# Patient Record
Sex: Female | Born: 1950 | Race: White | Hispanic: No | Marital: Married | Smoking: Never smoker
Health system: Southern US, Community
[De-identification: ages and names within clinical notes are randomized; demographics above are authoritative.]

---

## 1998-04-29 ENCOUNTER — Ambulatory Visit (HOSPITAL_COMMUNITY): Admission: RE | Admit: 1998-04-29 | Discharge: 1998-04-29 | Payer: Self-pay | Admitting: *Deleted

## 1999-09-02 ENCOUNTER — Ambulatory Visit (HOSPITAL_COMMUNITY): Admission: RE | Admit: 1999-09-02 | Discharge: 1999-09-02 | Payer: Self-pay | Admitting: *Deleted

## 1999-09-02 ENCOUNTER — Encounter: Payer: Self-pay | Admitting: *Deleted

## 2000-10-16 ENCOUNTER — Encounter: Payer: Self-pay | Admitting: *Deleted

## 2000-10-16 ENCOUNTER — Ambulatory Visit (HOSPITAL_COMMUNITY): Admission: RE | Admit: 2000-10-16 | Discharge: 2000-10-16 | Payer: Self-pay | Admitting: *Deleted

## 2002-04-18 ENCOUNTER — Encounter: Payer: Self-pay | Admitting: *Deleted

## 2002-04-18 ENCOUNTER — Ambulatory Visit (HOSPITAL_COMMUNITY): Admission: RE | Admit: 2002-04-18 | Discharge: 2002-04-18 | Payer: Self-pay | Admitting: *Deleted

## 2003-06-29 ENCOUNTER — Other Ambulatory Visit: Admission: RE | Admit: 2003-06-29 | Discharge: 2003-06-29 | Payer: Self-pay | Admitting: Obstetrics and Gynecology

## 2004-12-05 ENCOUNTER — Other Ambulatory Visit: Admission: RE | Admit: 2004-12-05 | Discharge: 2004-12-05 | Payer: Self-pay | Admitting: Obstetrics and Gynecology

## 2004-12-22 ENCOUNTER — Encounter: Admission: RE | Admit: 2004-12-22 | Discharge: 2004-12-22 | Payer: Self-pay | Admitting: Obstetrics and Gynecology

## 2006-02-06 ENCOUNTER — Ambulatory Visit (HOSPITAL_COMMUNITY): Admission: RE | Admit: 2006-02-06 | Discharge: 2006-02-06 | Payer: Self-pay | Admitting: Obstetrics and Gynecology

## 2006-02-28 ENCOUNTER — Encounter: Admission: RE | Admit: 2006-02-28 | Discharge: 2006-02-28 | Payer: Self-pay | Admitting: Obstetrics and Gynecology

## 2007-04-16 ENCOUNTER — Ambulatory Visit (HOSPITAL_COMMUNITY): Admission: RE | Admit: 2007-04-16 | Discharge: 2007-04-16 | Payer: Self-pay | Admitting: Obstetrics and Gynecology

## 2008-08-31 ENCOUNTER — Ambulatory Visit: Payer: Self-pay | Admitting: Internal Medicine

## 2008-08-31 DIAGNOSIS — G4730 Sleep apnea, unspecified: Secondary | ICD-10-CM | POA: Insufficient documentation

## 2008-08-31 DIAGNOSIS — J309 Allergic rhinitis, unspecified: Secondary | ICD-10-CM | POA: Insufficient documentation

## 2008-10-06 ENCOUNTER — Ambulatory Visit (HOSPITAL_COMMUNITY): Admission: RE | Admit: 2008-10-06 | Discharge: 2008-10-06 | Payer: Self-pay | Admitting: Obstetrics and Gynecology

## 2008-10-11 ENCOUNTER — Encounter: Payer: Self-pay | Admitting: Internal Medicine

## 2008-10-11 ENCOUNTER — Ambulatory Visit (HOSPITAL_BASED_OUTPATIENT_CLINIC_OR_DEPARTMENT_OTHER): Admission: RE | Admit: 2008-10-11 | Discharge: 2008-10-11 | Payer: Self-pay | Admitting: Internal Medicine

## 2008-10-18 ENCOUNTER — Ambulatory Visit: Payer: Self-pay | Admitting: Internal Medicine

## 2008-10-29 ENCOUNTER — Ambulatory Visit: Payer: Self-pay | Admitting: Internal Medicine

## 2008-12-12 ENCOUNTER — Encounter: Payer: Self-pay | Admitting: Internal Medicine

## 2008-12-31 ENCOUNTER — Ambulatory Visit: Payer: Self-pay | Admitting: Internal Medicine

## 2010-12-04 ENCOUNTER — Encounter: Payer: Self-pay | Admitting: Obstetrics and Gynecology

## 2011-03-28 NOTE — Procedures (Signed)
NAME:  Desiree Hurley, Desiree Hurley                 ACCOUNT NO.:  [id number]   MEDICAL RECORD NO.:  [id number]          PATIENT TYPE:  OUT   LOCATION:  SLEEP CENTER                 FACILITY:  [facility]   PHYSICIAN:  Clinton D. Maple Hudson, MD, FCCP, FACPDATE OF BIRTH:  1952/04/29   DATE OF STUDY:  10/11/2008                            NOCTURNAL POLYSOMNOGRAM   REFERRING PHYSICIAN:   INDICATION FOR STUDY:  Hypersomnia with sleep apnea.   EPWORTH SLEEPINESS SCORE:  Epworth sleepiness score 13/24. BMI 33.9.  Weight 210 pounds. Height 66 inches. Neck 15 inches.   MEDICATIONS:  Home medications are charted and reviewed.   SLEEP ARCHITECTURE:  Total sleep time 286 minutes with sleep efficiency  68.5%.  Stage I was 4.5%. Stage II  65%.  Stage III 12.7%. REM 17.6% of  total sleep time. Sleep latency is 32 minutes. REM latency 255 minutes.  Awake after sleep onset, 100 minutes. Arousal index 18.4. No bedtime  medication was reported.   RESPIRATORY DATA:  Apnea-hypopnea index (AHI) 20.5 per hour. A total of  98 events were scored including 1 mixed apnea and 97 hypopneas. Most  events were recorded while sleeping supine. REM AHI 8.3. There were  insufficient events to permit split-study protocol on the study night.   OXYGEN DATA:  Loud snoring with oxygen desaturation to a nadir of 83%.  Mean oxygen saturation through the study was 93% on room air.   CARDIAC DATA:  Normal sinus rhythm.   MOVEMENT/PARASOMNIA:  No significant movement disturbance. Bathroom x3.   IMPRESSIONS/RECOMMENDATIONS:  1. Moderate obstructive sleep apnea/hypopnea syndrome, AHI 20.5 per      hour with events hypopneas recorded while sleeping supine.  Loud      snoring with oxygen desaturation to a nadir of 83%.  2. The patient did not meet numbers criteria to permit CPAP titration      by split-study protocol on the study night.  The technician      reported trying a CPAP mask for initial fitting but stated that the      patient was having  panic      attacks and the mask had to be removed.  She may do better with a      nasal pillow's design and with the full-face mask which was tried.      Clinton D. Maple Hudson, MD, [facility], FACP  Diplomate, Biomedical engineer of Sleep Medicine  Electronically Signed     CDY/MEDQ  D:  10/18/2008 14:38:28  T:  10/19/2008 00:06:38  Job:  [id number]

## 2013-03-24 ENCOUNTER — Other Ambulatory Visit: Payer: Self-pay | Admitting: Family Medicine

## 2013-03-24 ENCOUNTER — Other Ambulatory Visit (HOSPITAL_COMMUNITY): Payer: Self-pay | Admitting: Family Medicine

## 2013-03-24 ENCOUNTER — Other Ambulatory Visit (HOSPITAL_COMMUNITY)
Admission: RE | Admit: 2013-03-24 | Discharge: 2013-03-24 | Disposition: A | Discharge status: Home / Self Care | Payer: 59 | Source: Ambulatory Visit | Attending: Family Medicine | Admitting: Family Medicine

## 2013-03-24 DIAGNOSIS — Z1151 Encounter for screening for human papillomavirus (HPV): Secondary | ICD-10-CM | POA: Insufficient documentation

## 2013-03-24 DIAGNOSIS — Z0000 Encounter for general adult medical examination without abnormal findings: Secondary | ICD-10-CM | POA: Insufficient documentation

## 2013-03-24 DIAGNOSIS — E2839 Other primary ovarian failure: Secondary | ICD-10-CM

## 2013-04-28 ENCOUNTER — Other Ambulatory Visit (HOSPITAL_COMMUNITY): Payer: Self-pay | Admitting: Family Medicine

## 2013-04-28 DIAGNOSIS — Z1231 Encounter for screening mammogram for malignant neoplasm of breast: Secondary | ICD-10-CM

## 2013-04-29 ENCOUNTER — Ambulatory Visit (HOSPITAL_COMMUNITY)
Admission: RE | Admit: 2013-04-29 | Discharge: 2013-04-29 | Disposition: A | Discharge status: Home / Self Care | Payer: 59 | Source: Ambulatory Visit | Attending: Family Medicine | Admitting: Family Medicine

## 2013-04-29 DIAGNOSIS — E2839 Other primary ovarian failure: Secondary | ICD-10-CM

## 2013-04-29 DIAGNOSIS — Z13820 Encounter for screening for osteoporosis: Secondary | ICD-10-CM | POA: Insufficient documentation

## 2013-04-29 DIAGNOSIS — Z1231 Encounter for screening mammogram for malignant neoplasm of breast: Secondary | ICD-10-CM

## 2013-04-29 DIAGNOSIS — Z780 Asymptomatic menopausal state: Secondary | ICD-10-CM | POA: Insufficient documentation

## 2013-05-29 ENCOUNTER — Emergency Department (HOSPITAL_COMMUNITY)
Admission: EM | Admit: 2013-05-29 | Discharge: 2013-05-29 | Disposition: A | Discharge status: Home / Self Care | Payer: 59 | Attending: Emergency Medicine | Admitting: Emergency Medicine

## 2013-05-29 ENCOUNTER — Encounter (HOSPITAL_COMMUNITY): Payer: Self-pay

## 2013-05-29 DIAGNOSIS — Y9301 Activity, walking, marching and hiking: Secondary | ICD-10-CM | POA: Insufficient documentation

## 2013-05-29 DIAGNOSIS — Y9289 Other specified places as the place of occurrence of the external cause: Secondary | ICD-10-CM | POA: Insufficient documentation

## 2013-05-29 DIAGNOSIS — IMO0002 Reserved for concepts with insufficient information to code with codable children: Secondary | ICD-10-CM | POA: Insufficient documentation

## 2013-05-29 DIAGNOSIS — S76011A Strain of muscle, fascia and tendon of right hip, initial encounter: Secondary | ICD-10-CM

## 2013-05-29 DIAGNOSIS — X500XXA Overexertion from strenuous movement or load, initial encounter: Secondary | ICD-10-CM | POA: Insufficient documentation

## 2013-05-29 MED ORDER — CYCLOBENZAPRINE HCL 10 MG PO TABS: 10.0000 mg | ORAL_TABLET | Freq: Two times a day (BID) | ORAL | Status: AC | PRN

## 2013-05-29 MED ORDER — CYCLOBENZAPRINE HCL 10 MG PO TABS
10.0000 mg | ORAL_TABLET | Freq: Once | ORAL | Status: AC
Start: 2013-05-29 — End: 2013-05-29
  Administered 2013-05-29: 10 mg via ORAL
  Filled 2013-05-29: qty 1

## 2013-05-29 MED ORDER — IBUPROFEN 400 MG PO TABS
400.0000 mg | ORAL_TABLET | Freq: Once | ORAL | Status: AC
  Administered 2013-05-29: 400 mg via ORAL
  Filled 2013-05-29: qty 1

## 2013-05-29 NOTE — ED Provider Notes (Signed)
History    This chart was scribed for Wynetta Emery, non-physician practitioner working with Ashby Dawes, MD by Leone Payor, ED Scribe. This patient was seen in room TR10C/TR10C and the patient's care was started at 1725.  CSN: [id number] Arrival date & time 05/29/13  1725  None    Chief Complaint  Patient presents with  . Hip Pain    The history is provided by the patient. No language interpreter was used.    HPI Comments: Desiree Hurley is a 62 y.o. female who presents to the Emergency Department complaining of constant, unchanged R upper thigh pain starting earlier today. Pt states she was walking down steps when she had sudden onset pain. States she felt a pop to her upper right lateral thigh area after onset of pain. Pain is rated as 9-10/10 with movement and bearing weight. She has minimal pain with rest. She denies any recent injury or trauma to the area. Pt is able to ambulate.   History reviewed. No pertinent past medical history. History reviewed. No pertinent past surgical history. No family history on file. History  Substance Use Topics  . Smoking status: Never Smoker   . Smokeless tobacco: Not on file  . Alcohol Use: No   OB History   Grav Para Term Preterm Abortions TAB SAB Ect Mult Living                 Review of Systems  Constitutional: Negative for fever.  Respiratory: Negative for shortness of breath.   Cardiovascular: Negative for chest pain.  Gastrointestinal: Negative for nausea, vomiting, abdominal pain and diarrhea.  Musculoskeletal: Positive for myalgias (Right lateral thigh pain).  All other systems reviewed and are negative.    Allergies  Review of patient's allergies indicates no known allergies.  Home Medications   Current Outpatient Rx  Name  Route  Sig  Dispense  Refill  . Calcium Carb-Vit D-Soy Isoflav (CALTRATE 600 + SOY PO)   Oral   Take 1 tablet by mouth daily.         Marland Kitchen ibuprofen (ADVIL,MOTRIN) 200 MG tablet  Oral   Take 800 mg by mouth as needed for pain.         . Multiple Vitamins-Minerals (MULTIVITAMIN WITH MINERALS) tablet   Oral   Take 1 tablet by mouth daily.         . Omega-3 Fatty Acids (FISH OIL) 1000 MG CAPS   Oral   Take 1 capsule by mouth daily.          BP 141/80  Pulse 88  Temp(Src) 98.4 F (36.9 C) (Oral)  Resp 16  SpO2 96% Physical Exam  Musculoskeletal:       Legs: Point tenderness to palpation of the proximal right lateral thigh. Neurovascularly intact. Patient is able to ambulate with antalgic gait.     ED Course  Procedures (including critical care time)  DIAGNOSTIC STUDIES: Oxygen Saturation is 96% on RA, adequate by my interpretation.    COORDINATION OF CARE: 5:45 PM Discussed treatment plan with pt at bedside and pt agreed to plan.   Labs Reviewed - No data to display No results found. 1. Strain of right hip and thigh     MDM   Filed Vitals:   05/29/13 1730  BP: 141/80  Pulse: 88  Temp: 98.4 F (36.9 C)  TempSrc: Oral  Resp: 16  SpO2: 96%     Desiree Hurley is a 62 y.o. female with likely  right thigh strain, Neurovascularly intact.   Medications  cyclobenzaprine (FLEXERIL) tablet 10 mg (not administered)  ibuprofen (ADVIL,MOTRIN) tablet 400 mg (not administered)    Pt is hemodynamically stable, appropriate for, and amenable to discharge at this time. Pt verbalized understanding and agrees with care plan. Outpatient follow-up and specific return precautions discussed.    New Prescriptions   CYCLOBENZAPRINE (FLEXERIL) 10 MG TABLET    Take 1 tablet (10 mg total) by mouth 2 (two) times daily as needed for muscle spasms.     Wynetta Emery, PA-C 05/29/13 1757

## 2013-05-29 NOTE — ED Provider Notes (Signed)
Medical screening examination/treatment/procedure(s) were performed by non-physician practitioner and as supervising physician I was immediately available for consultation/collaboration.   Ashby Dawes, MD 05/29/13 [phone]

## 2013-05-29 NOTE — ED Notes (Addendum)
Rt. Hip pain began after pt. Was leaving working walking down the steps and felt something pop in her rt. Lateral hip area.  Pt. Was able to put weight on the leg. But intense pain.  Pt. Has been having intermittent pain this past week in the same hip.  Denies any  injury

## 2013-06-10 ENCOUNTER — Other Ambulatory Visit (HOSPITAL_COMMUNITY): Payer: Self-pay | Admitting: Orthopedic Surgery

## 2013-06-10 DIAGNOSIS — M545 Low back pain, unspecified: Secondary | ICD-10-CM

## 2013-06-18 ENCOUNTER — Ambulatory Visit (HOSPITAL_COMMUNITY)
Admission: RE | Admit: 2013-06-18 | Discharge: 2013-06-18 | Disposition: A | Discharge status: Home / Self Care | Payer: 59 | Source: Ambulatory Visit | Attending: Orthopedic Surgery | Admitting: Orthopedic Surgery

## 2013-06-18 DIAGNOSIS — Q762 Congenital spondylolisthesis: Secondary | ICD-10-CM | POA: Insufficient documentation

## 2013-06-18 DIAGNOSIS — M129 Arthropathy, unspecified: Secondary | ICD-10-CM | POA: Insufficient documentation

## 2013-06-18 DIAGNOSIS — M545 Low back pain, unspecified: Secondary | ICD-10-CM

## 2013-08-28 ENCOUNTER — Other Ambulatory Visit: Payer: Self-pay | Admitting: Gastroenterology

## 2016-05-26 DIAGNOSIS — L0100 Impetigo, unspecified: Secondary | ICD-10-CM | POA: Diagnosis not present

## 2016-05-26 MED FILL — CEPHALEXIN 500 MG CAPSULE: 500 | 7 days supply | Qty: 21 | Fill #0

## 2016-05-26 MED FILL — MUPIROCIN 2% OINTMENT: 2 | 10 days supply | Qty: 22 | Fill #0

## 2016-05-30 DIAGNOSIS — L0100 Impetigo, unspecified: Secondary | ICD-10-CM | POA: Diagnosis not present

## 2016-05-30 MED FILL — DOXYCYCLINE HYCLATE 100 MG: 100 | 10 days supply | Qty: 20 | Fill #0

## 2016-05-30 MED FILL — SULFAMETHOXAZOLE-TMP DS TAB: 800-160 | 10 days supply | Qty: 40 | Fill #0

## 2016-06-05 DIAGNOSIS — L309 Dermatitis, unspecified: Secondary | ICD-10-CM | POA: Diagnosis not present

## 2016-06-05 DIAGNOSIS — D239 Other benign neoplasm of skin, unspecified: Secondary | ICD-10-CM | POA: Diagnosis not present

## 2016-06-05 MED FILL — HYDROCORTISONE 2.5% OINT: 2.5 | 10 days supply | Qty: 28 | Fill #0

## 2016-06-05 MED FILL — metroNIDAZOLE 500 MG TABS: 500 | 30 days supply | Qty: 15 | Fill #0

## 2016-06-15 DIAGNOSIS — L309 Dermatitis, unspecified: Secondary | ICD-10-CM | POA: Diagnosis not present

## 2016-07-06 MED FILL — metroNIDAZOLE 0.75 % LOTN: 0.75 | 30 days supply | Qty: 59 | Fill #0

## 2017-01-29 ENCOUNTER — Encounter (HOSPITAL_COMMUNITY): Payer: Self-pay

## 2017-01-29 ENCOUNTER — Ambulatory Visit (HOSPITAL_COMMUNITY)
Admission: EM | Admit: 2017-01-29 | Discharge: 2017-01-29 | Disposition: A | Discharge status: Home / Self Care | Payer: 59 | Attending: Family Medicine | Admitting: Family Medicine

## 2017-01-29 DIAGNOSIS — H9203 Otalgia, bilateral: Secondary | ICD-10-CM

## 2017-01-29 DIAGNOSIS — H6983 Other specified disorders of Eustachian tube, bilateral: Secondary | ICD-10-CM

## 2017-01-29 MED ORDER — IPRATROPIUM BROMIDE 0.06 % NA SOLN: 2.0000 | Freq: Four times a day (QID) | NASAL | 0 refills | Status: AC

## 2017-01-29 MED ORDER — METHYLPREDNISOLONE 4 MG PO TBPK: ORAL_TABLET | ORAL | 0 refills | Status: AC

## 2017-01-29 NOTE — ED Provider Notes (Signed)
CSN: [id number]     Arrival date & time 01/29/17  1642 History   None    Chief Complaint  Patient presents with  . Otalgia   (Consider location/radiation/quality/duration/timing/severity/associated sxs/prior Treatment) Patient c/o bilateral ear discomfort and nasal drainage.   The history is provided by the patient.  Otalgia  Location:  Bilateral Behind ear:  No abnormality Quality:  Aching Severity:  Mild Onset quality:  Sudden Duration:  1 day Timing:  Constant Progression:  Waxing and waning Chronicity:  New Relieved by:  Nothing Worsened by:  Nothing Ineffective treatments:  None tried Associated symptoms: rhinorrhea     History reviewed. No pertinent past medical history. History reviewed. No pertinent surgical history. History reviewed. No pertinent family history. Social History  Substance Use Topics  . Smoking status: Never Smoker  . Smokeless tobacco: Never Used  . Alcohol use No   OB History    No data available     Review of Systems  Constitutional: Negative.   HENT: Positive for ear pain and rhinorrhea.   Eyes: Negative.   Respiratory: Negative.   Cardiovascular: Negative.   Gastrointestinal: Negative.   Endocrine: Negative.   Genitourinary: Negative.   Musculoskeletal: Negative.   Allergic/Immunologic: Negative.   Neurological: Negative.   Hematological: Negative.   Psychiatric/Behavioral: Negative.     Allergies  Patient has no known allergies.  Home Medications   Prior to Admission medications   Medication Sig Start Date End Date Taking? Authorizing Provider  Calcium Carb-Vit D-Soy Isoflav (CALTRATE 600 + SOY PO) Take 1 tablet by mouth daily.   Yes Historical Provider, MD  Multiple Vitamins-Minerals (MULTIVITAMIN WITH MINERALS) tablet Take 1 tablet by mouth daily.   Yes Historical Provider, MD  Omega-3 Fatty Acids (FISH OIL) 1000 MG CAPS Take 1 capsule by mouth daily.   Yes Historical Provider, MD  cyclobenzaprine (FLEXERIL) 10 MG  tablet Take 1 tablet (10 mg total) by mouth 2 (two) times daily as needed for muscle spasms. 05/29/13   Nicole Pisciotta, PA-C  ibuprofen (ADVIL,MOTRIN) 200 MG tablet Take 800 mg by mouth as needed for pain.    Historical Provider, MD  ipratropium (ATROVENT) 0.06 % nasal spray Place 2 sprays into both nostrils 4 (four) times daily. 01/29/17   Lysbeth Penner, FNP  methylPREDNISolone (MEDROL DOSEPAK) 4 MG TBPK tablet Take 6-5-4-3-2-1 po qd 01/29/17   Lysbeth Penner, FNP   Meds Ordered and Administered this Visit  Medications - No data to display  BP 131/62 (BP Location: Left Arm)   Pulse 86   Temp 98.4 F (36.9 C) (Oral)   Resp 17   SpO2 97%  No data found.   Physical Exam  Constitutional: She is oriented to person, place, and time. She appears well-developed and well-nourished.  HENT:  Head: Normocephalic and atraumatic.  Mouth/Throat: Oropharynx is clear and moist.  Left TM retracted Right TM bulging  Eyes: Conjunctivae and EOM are normal. Pupils are equal, round, and reactive to light.  Neck: Normal range of motion. Neck supple.  Cardiovascular: Normal rate, regular rhythm and normal heart sounds.   Pulmonary/Chest: Effort normal and breath sounds normal.  Abdominal: Soft. Bowel sounds are normal.  Neurological: She is alert and oriented to person, place, and time.  Nursing note and vitals reviewed.   Urgent Care Course     Procedures (including critical care time)  Labs Review Labs Reviewed - No data to display  Imaging Review No results found.   Visual Acuity Review  Right  Eye Distance:   Left Eye Distance:   Bilateral Distance:    Right Eye Near:   Left Eye Near:    Bilateral Near:         MDM   1. Otalgia of both ears   2. ETD (Eustachian tube dysfunction), bilateral    Medrol dose pack as directed 4mg  #21 Ipratropium Nasal Spray 0.06% 2 sprays per nostril tid  Push po fluids, rest, tylenol and motrin otc prn as directed for fever, arthralgias,  and myalgias.  Follow up prn if sx's continue or persist.    Lysbeth Penner, FNP 01/29/17 [phone]

## 2017-01-29 NOTE — ED Triage Notes (Addendum)
Patient presents to [facility] with bilateral ear pain, pt states she is also experiencing nasal drainage since last night 01/28/2017. Pt thinks possible ear infection

## 2017-01-30 MED FILL — METHYLPREDNISOLONE 4 MG TAB: 4 | 6 days supply | Qty: 21 | Fill #0

## 2017-01-30 MED FILL — IPRATROPIUM 0.06% SPRAY: 0.06 | 30 days supply | Qty: 15 | Fill #0

## 2017-04-04 DIAGNOSIS — M9903 Segmental and somatic dysfunction of lumbar region: Secondary | ICD-10-CM | POA: Diagnosis not present

## 2017-04-04 DIAGNOSIS — S335XXA Sprain of ligaments of lumbar spine, initial encounter: Secondary | ICD-10-CM | POA: Diagnosis not present

## 2017-04-04 DIAGNOSIS — M9906 Segmental and somatic dysfunction of lower extremity: Secondary | ICD-10-CM | POA: Diagnosis not present

## 2017-04-04 DIAGNOSIS — S83411A Sprain of medial collateral ligament of right knee, initial encounter: Secondary | ICD-10-CM | POA: Diagnosis not present

## 2017-04-05 DIAGNOSIS — M9906 Segmental and somatic dysfunction of lower extremity: Secondary | ICD-10-CM | POA: Diagnosis not present

## 2017-04-05 DIAGNOSIS — S83411A Sprain of medial collateral ligament of right knee, initial encounter: Secondary | ICD-10-CM | POA: Diagnosis not present

## 2017-04-05 DIAGNOSIS — M9903 Segmental and somatic dysfunction of lumbar region: Secondary | ICD-10-CM | POA: Diagnosis not present

## 2017-04-05 DIAGNOSIS — S335XXA Sprain of ligaments of lumbar spine, initial encounter: Secondary | ICD-10-CM | POA: Diagnosis not present

## 2017-04-10 DIAGNOSIS — M9903 Segmental and somatic dysfunction of lumbar region: Secondary | ICD-10-CM | POA: Diagnosis not present

## 2017-04-10 DIAGNOSIS — S83411A Sprain of medial collateral ligament of right knee, initial encounter: Secondary | ICD-10-CM | POA: Diagnosis not present

## 2017-04-10 DIAGNOSIS — M9906 Segmental and somatic dysfunction of lower extremity: Secondary | ICD-10-CM | POA: Diagnosis not present

## 2017-04-10 DIAGNOSIS — S335XXA Sprain of ligaments of lumbar spine, initial encounter: Secondary | ICD-10-CM | POA: Diagnosis not present

## 2017-04-11 DIAGNOSIS — S335XXA Sprain of ligaments of lumbar spine, initial encounter: Secondary | ICD-10-CM | POA: Diagnosis not present

## 2017-04-11 DIAGNOSIS — M9903 Segmental and somatic dysfunction of lumbar region: Secondary | ICD-10-CM | POA: Diagnosis not present

## 2017-04-11 DIAGNOSIS — M9906 Segmental and somatic dysfunction of lower extremity: Secondary | ICD-10-CM | POA: Diagnosis not present

## 2017-04-11 DIAGNOSIS — S83411A Sprain of medial collateral ligament of right knee, initial encounter: Secondary | ICD-10-CM | POA: Diagnosis not present

## 2017-04-16 DIAGNOSIS — S335XXA Sprain of ligaments of lumbar spine, initial encounter: Secondary | ICD-10-CM | POA: Diagnosis not present

## 2017-04-16 DIAGNOSIS — M9903 Segmental and somatic dysfunction of lumbar region: Secondary | ICD-10-CM | POA: Diagnosis not present

## 2017-04-16 DIAGNOSIS — S83411A Sprain of medial collateral ligament of right knee, initial encounter: Secondary | ICD-10-CM | POA: Diagnosis not present

## 2017-04-16 DIAGNOSIS — M9906 Segmental and somatic dysfunction of lower extremity: Secondary | ICD-10-CM | POA: Diagnosis not present

## 2017-04-18 DIAGNOSIS — M9906 Segmental and somatic dysfunction of lower extremity: Secondary | ICD-10-CM | POA: Diagnosis not present

## 2017-04-18 DIAGNOSIS — S335XXA Sprain of ligaments of lumbar spine, initial encounter: Secondary | ICD-10-CM | POA: Diagnosis not present

## 2017-04-18 DIAGNOSIS — M9903 Segmental and somatic dysfunction of lumbar region: Secondary | ICD-10-CM | POA: Diagnosis not present

## 2017-04-18 DIAGNOSIS — S83411A Sprain of medial collateral ligament of right knee, initial encounter: Secondary | ICD-10-CM | POA: Diagnosis not present

## 2017-04-23 DIAGNOSIS — S83411A Sprain of medial collateral ligament of right knee, initial encounter: Secondary | ICD-10-CM | POA: Diagnosis not present

## 2017-04-23 DIAGNOSIS — S335XXA Sprain of ligaments of lumbar spine, initial encounter: Secondary | ICD-10-CM | POA: Diagnosis not present

## 2017-04-23 DIAGNOSIS — M9906 Segmental and somatic dysfunction of lower extremity: Secondary | ICD-10-CM | POA: Diagnosis not present

## 2017-04-23 DIAGNOSIS — M9903 Segmental and somatic dysfunction of lumbar region: Secondary | ICD-10-CM | POA: Diagnosis not present

## 2017-04-25 DIAGNOSIS — S335XXA Sprain of ligaments of lumbar spine, initial encounter: Secondary | ICD-10-CM | POA: Diagnosis not present

## 2017-04-25 DIAGNOSIS — M9906 Segmental and somatic dysfunction of lower extremity: Secondary | ICD-10-CM | POA: Diagnosis not present

## 2017-04-25 DIAGNOSIS — S83411A Sprain of medial collateral ligament of right knee, initial encounter: Secondary | ICD-10-CM | POA: Diagnosis not present

## 2017-04-25 DIAGNOSIS — M9903 Segmental and somatic dysfunction of lumbar region: Secondary | ICD-10-CM | POA: Diagnosis not present

## 2017-04-30 DIAGNOSIS — M9903 Segmental and somatic dysfunction of lumbar region: Secondary | ICD-10-CM | POA: Diagnosis not present

## 2017-04-30 DIAGNOSIS — S335XXA Sprain of ligaments of lumbar spine, initial encounter: Secondary | ICD-10-CM | POA: Diagnosis not present

## 2017-04-30 DIAGNOSIS — M9906 Segmental and somatic dysfunction of lower extremity: Secondary | ICD-10-CM | POA: Diagnosis not present

## 2017-04-30 DIAGNOSIS — S83411A Sprain of medial collateral ligament of right knee, initial encounter: Secondary | ICD-10-CM | POA: Diagnosis not present

## 2017-05-02 DIAGNOSIS — M9906 Segmental and somatic dysfunction of lower extremity: Secondary | ICD-10-CM | POA: Diagnosis not present

## 2017-05-02 DIAGNOSIS — M9903 Segmental and somatic dysfunction of lumbar region: Secondary | ICD-10-CM | POA: Diagnosis not present

## 2017-05-02 DIAGNOSIS — S83411A Sprain of medial collateral ligament of right knee, initial encounter: Secondary | ICD-10-CM | POA: Diagnosis not present

## 2017-05-02 DIAGNOSIS — S335XXA Sprain of ligaments of lumbar spine, initial encounter: Secondary | ICD-10-CM | POA: Diagnosis not present

## 2017-05-08 DIAGNOSIS — M9903 Segmental and somatic dysfunction of lumbar region: Secondary | ICD-10-CM | POA: Diagnosis not present

## 2017-05-08 DIAGNOSIS — S335XXA Sprain of ligaments of lumbar spine, initial encounter: Secondary | ICD-10-CM | POA: Diagnosis not present

## 2017-05-08 DIAGNOSIS — M9906 Segmental and somatic dysfunction of lower extremity: Secondary | ICD-10-CM | POA: Diagnosis not present

## 2017-05-08 DIAGNOSIS — S83411A Sprain of medial collateral ligament of right knee, initial encounter: Secondary | ICD-10-CM | POA: Diagnosis not present

## 2017-05-28 DIAGNOSIS — M9903 Segmental and somatic dysfunction of lumbar region: Secondary | ICD-10-CM | POA: Diagnosis not present

## 2017-05-28 DIAGNOSIS — S83411A Sprain of medial collateral ligament of right knee, initial encounter: Secondary | ICD-10-CM | POA: Diagnosis not present

## 2017-05-28 DIAGNOSIS — M9906 Segmental and somatic dysfunction of lower extremity: Secondary | ICD-10-CM | POA: Diagnosis not present

## 2017-05-28 DIAGNOSIS — S335XXA Sprain of ligaments of lumbar spine, initial encounter: Secondary | ICD-10-CM | POA: Diagnosis not present

## 2017-05-30 DIAGNOSIS — M9906 Segmental and somatic dysfunction of lower extremity: Secondary | ICD-10-CM | POA: Diagnosis not present

## 2017-05-30 DIAGNOSIS — S335XXA Sprain of ligaments of lumbar spine, initial encounter: Secondary | ICD-10-CM | POA: Diagnosis not present

## 2017-05-30 DIAGNOSIS — M9903 Segmental and somatic dysfunction of lumbar region: Secondary | ICD-10-CM | POA: Diagnosis not present

## 2017-05-30 DIAGNOSIS — S83411A Sprain of medial collateral ligament of right knee, initial encounter: Secondary | ICD-10-CM | POA: Diagnosis not present

## 2017-12-10 DIAGNOSIS — L259 Unspecified contact dermatitis, unspecified cause: Secondary | ICD-10-CM | POA: Diagnosis not present

## 2017-12-10 DIAGNOSIS — L719 Rosacea, unspecified: Secondary | ICD-10-CM | POA: Diagnosis not present

## 2017-12-14 DIAGNOSIS — J069 Acute upper respiratory infection, unspecified: Secondary | ICD-10-CM | POA: Diagnosis not present

## 2017-12-14 DIAGNOSIS — R509 Fever, unspecified: Secondary | ICD-10-CM | POA: Diagnosis not present

## 2017-12-14 MED FILL — BENZONATATE 100 MG CAP: 100 | 6 days supply | Qty: 40 | Fill #0

## 2017-12-14 MED FILL — AZITHROMYCIN 250 MG TAB: 250 | 5 days supply | Qty: 6 | Fill #0

## 2020-08-13 ENCOUNTER — Other Ambulatory Visit: Payer: Self-pay | Admitting: Family Medicine

## 2020-08-13 DIAGNOSIS — Z1231 Encounter for screening mammogram for malignant neoplasm of breast: Secondary | ICD-10-CM

## 2020-08-16 ENCOUNTER — Other Ambulatory Visit: Payer: Self-pay

## 2020-08-16 ENCOUNTER — Ambulatory Visit
Admission: RE | Admit: 2020-08-16 | Discharge: 2020-08-16 | Disposition: A | Discharge status: Home / Self Care | Payer: Medicare Other | Source: Ambulatory Visit | Attending: Family Medicine | Admitting: Family Medicine

## 2020-08-16 DIAGNOSIS — Z1231 Encounter for screening mammogram for malignant neoplasm of breast: Secondary | ICD-10-CM

## 2021-02-17 DIAGNOSIS — Z0000 Encounter for general adult medical examination without abnormal findings: Secondary | ICD-10-CM | POA: Diagnosis not present

## 2021-02-17 DIAGNOSIS — Z1159 Encounter for screening for other viral diseases: Secondary | ICD-10-CM | POA: Diagnosis not present

## 2021-02-17 DIAGNOSIS — Z136 Encounter for screening for cardiovascular disorders: Secondary | ICD-10-CM | POA: Diagnosis not present

## 2021-02-17 DIAGNOSIS — Z23 Encounter for immunization: Secondary | ICD-10-CM | POA: Diagnosis not present

## 2021-02-23 ENCOUNTER — Other Ambulatory Visit: Payer: Self-pay | Admitting: Family Medicine

## 2021-02-23 DIAGNOSIS — M8580 Other specified disorders of bone density and structure, unspecified site: Secondary | ICD-10-CM

## 2021-03-23 DIAGNOSIS — R7309 Other abnormal glucose: Secondary | ICD-10-CM | POA: Diagnosis not present

## 2021-08-10 ENCOUNTER — Other Ambulatory Visit: Payer: Self-pay | Admitting: Family Medicine

## 2021-08-10 DIAGNOSIS — Z1231 Encounter for screening mammogram for malignant neoplasm of breast: Secondary | ICD-10-CM

## 2021-08-11 DIAGNOSIS — R7309 Other abnormal glucose: Secondary | ICD-10-CM | POA: Diagnosis not present

## 2021-11-17 ENCOUNTER — Ambulatory Visit
Admission: RE | Admit: 2021-11-17 | Discharge: 2021-11-17 | Disposition: A | Discharge status: Home / Self Care | Payer: Medicare Other | Source: Ambulatory Visit | Attending: Family Medicine | Admitting: Family Medicine

## 2021-11-17 DIAGNOSIS — M8580 Other specified disorders of bone density and structure, unspecified site: Secondary | ICD-10-CM

## 2021-11-17 DIAGNOSIS — M85852 Other specified disorders of bone density and structure, left thigh: Secondary | ICD-10-CM | POA: Diagnosis not present

## 2021-11-17 DIAGNOSIS — Z1231 Encounter for screening mammogram for malignant neoplasm of breast: Secondary | ICD-10-CM | POA: Diagnosis not present

## 2021-11-17 DIAGNOSIS — Z780 Asymptomatic menopausal state: Secondary | ICD-10-CM | POA: Diagnosis not present

## 2021-11-18 ENCOUNTER — Other Ambulatory Visit: Payer: Self-pay | Admitting: Family Medicine

## 2021-11-18 DIAGNOSIS — R928 Other abnormal and inconclusive findings on diagnostic imaging of breast: Secondary | ICD-10-CM

## 2021-12-29 ENCOUNTER — Other Ambulatory Visit: Payer: Medicare Other

## 2021-12-30 ENCOUNTER — Other Ambulatory Visit: Payer: Self-pay

## 2021-12-30 ENCOUNTER — Ambulatory Visit
Admission: RE | Admit: 2021-12-30 | Discharge: 2021-12-30 | Disposition: A | Discharge status: Home / Self Care | Payer: Medicare Other | Source: Ambulatory Visit | Attending: Family Medicine | Admitting: Family Medicine

## 2021-12-30 DIAGNOSIS — R928 Other abnormal and inconclusive findings on diagnostic imaging of breast: Secondary | ICD-10-CM

## 2021-12-30 DIAGNOSIS — R922 Inconclusive mammogram: Secondary | ICD-10-CM | POA: Diagnosis not present

## 2022-03-02 DIAGNOSIS — Z0000 Encounter for general adult medical examination without abnormal findings: Secondary | ICD-10-CM | POA: Diagnosis not present

## 2022-03-02 DIAGNOSIS — E7800 Pure hypercholesterolemia, unspecified: Secondary | ICD-10-CM | POA: Diagnosis not present

## 2022-03-02 DIAGNOSIS — E1169 Type 2 diabetes mellitus with other specified complication: Secondary | ICD-10-CM | POA: Diagnosis not present

## 2022-11-09 ENCOUNTER — Other Ambulatory Visit: Payer: Self-pay | Admitting: Family Medicine

## 2022-11-09 DIAGNOSIS — Z1231 Encounter for screening mammogram for malignant neoplasm of breast: Secondary | ICD-10-CM

## 2023-01-01 ENCOUNTER — Ambulatory Visit
Admission: RE | Admit: 2023-01-01 | Discharge: 2023-01-01 | Disposition: A | Discharge status: Home / Self Care | Payer: Medicare Other | Source: Ambulatory Visit | Attending: Family Medicine | Admitting: Family Medicine

## 2023-01-01 DIAGNOSIS — Z1231 Encounter for screening mammogram for malignant neoplasm of breast: Secondary | ICD-10-CM | POA: Diagnosis not present

## 2023-03-11 DIAGNOSIS — R051 Acute cough: Secondary | ICD-10-CM | POA: Diagnosis not present

## 2023-03-11 DIAGNOSIS — H6693 Otitis media, unspecified, bilateral: Secondary | ICD-10-CM | POA: Diagnosis not present

## 2023-03-11 DIAGNOSIS — R509 Fever, unspecified: Secondary | ICD-10-CM | POA: Diagnosis not present

## 2023-03-11 DIAGNOSIS — H1033 Unspecified acute conjunctivitis, bilateral: Secondary | ICD-10-CM | POA: Diagnosis not present

## 2023-03-11 DIAGNOSIS — H9203 Otalgia, bilateral: Secondary | ICD-10-CM | POA: Diagnosis not present

## 2023-03-30 DIAGNOSIS — H6593 Unspecified nonsuppurative otitis media, bilateral: Secondary | ICD-10-CM | POA: Diagnosis not present

## 2023-03-30 DIAGNOSIS — R070 Pain in throat: Secondary | ICD-10-CM | POA: Diagnosis not present

## 2023-03-30 DIAGNOSIS — H6993 Unspecified Eustachian tube disorder, bilateral: Secondary | ICD-10-CM | POA: Diagnosis not present

## 2023-04-03 DIAGNOSIS — G4730 Sleep apnea, unspecified: Secondary | ICD-10-CM | POA: Diagnosis not present

## 2023-04-03 DIAGNOSIS — E119 Type 2 diabetes mellitus without complications: Secondary | ICD-10-CM | POA: Diagnosis not present

## 2023-04-03 DIAGNOSIS — Z0000 Encounter for general adult medical examination without abnormal findings: Secondary | ICD-10-CM | POA: Diagnosis not present

## 2023-04-03 DIAGNOSIS — E1169 Type 2 diabetes mellitus with other specified complication: Secondary | ICD-10-CM | POA: Diagnosis not present

## 2023-04-03 DIAGNOSIS — Z1211 Encounter for screening for malignant neoplasm of colon: Secondary | ICD-10-CM | POA: Diagnosis not present

## 2023-04-03 DIAGNOSIS — E7800 Pure hypercholesterolemia, unspecified: Secondary | ICD-10-CM | POA: Diagnosis not present

## 2023-04-03 DIAGNOSIS — R030 Elevated blood-pressure reading, without diagnosis of hypertension: Secondary | ICD-10-CM | POA: Diagnosis not present

## 2023-05-16 DIAGNOSIS — E7800 Pure hypercholesterolemia, unspecified: Secondary | ICD-10-CM | POA: Diagnosis not present

## 2023-05-16 DIAGNOSIS — E1169 Type 2 diabetes mellitus with other specified complication: Secondary | ICD-10-CM | POA: Diagnosis not present

## 2023-09-06 DIAGNOSIS — H60391 Other infective otitis externa, right ear: Secondary | ICD-10-CM | POA: Diagnosis not present

## 2023-09-06 DIAGNOSIS — H9201 Otalgia, right ear: Secondary | ICD-10-CM | POA: Diagnosis not present

## 2023-12-07 ENCOUNTER — Other Ambulatory Visit: Payer: Self-pay | Admitting: Family Medicine

## 2023-12-07 DIAGNOSIS — Z1231 Encounter for screening mammogram for malignant neoplasm of breast: Secondary | ICD-10-CM

## 2024-01-03 ENCOUNTER — Ambulatory Visit: Payer: Medicare Other

## 2024-01-08 ENCOUNTER — Ambulatory Visit
Admission: RE | Admit: 2024-01-08 | Discharge: 2024-01-08 | Disposition: A | Discharge status: Home / Self Care | Payer: Medicare Other | Source: Ambulatory Visit | Attending: Family Medicine | Admitting: Family Medicine

## 2024-01-08 DIAGNOSIS — Z1231 Encounter for screening mammogram for malignant neoplasm of breast: Secondary | ICD-10-CM

## 2024-01-11 ENCOUNTER — Other Ambulatory Visit: Payer: Self-pay | Admitting: Family Medicine

## 2024-01-11 DIAGNOSIS — R928 Other abnormal and inconclusive findings on diagnostic imaging of breast: Secondary | ICD-10-CM

## 2024-01-29 IMAGING — MG MM DIGITAL DIAGNOSTIC UNILAT*L* W/ TOMO W/ CAD
4 series · 4 of 12 positions shown · non-contrast
Comparison: Previous exam(s).

CLINICAL DATA: Screening recall for a possible left breast mass.
The patient reports a history of a remote benign left breast
excision, with a faint scar in the upper breast, which was marked on
the screening study dated 08/16/2020.

EXAM:
DIGITAL DIAGNOSTIC UNILATERAL LEFT MAMMOGRAM WITH TOMOSYNTHESIS AND
CAD; ULTRASOUND LEFT BREAST LIMITED
TECHNIQUE: Left digital diagnostic mammography and breast tomosynthesis was
performed. The images were evaluated with computer-aided detection.;
Targeted ultrasound examination of the left breast was performed.

[L MLO synth-2D]
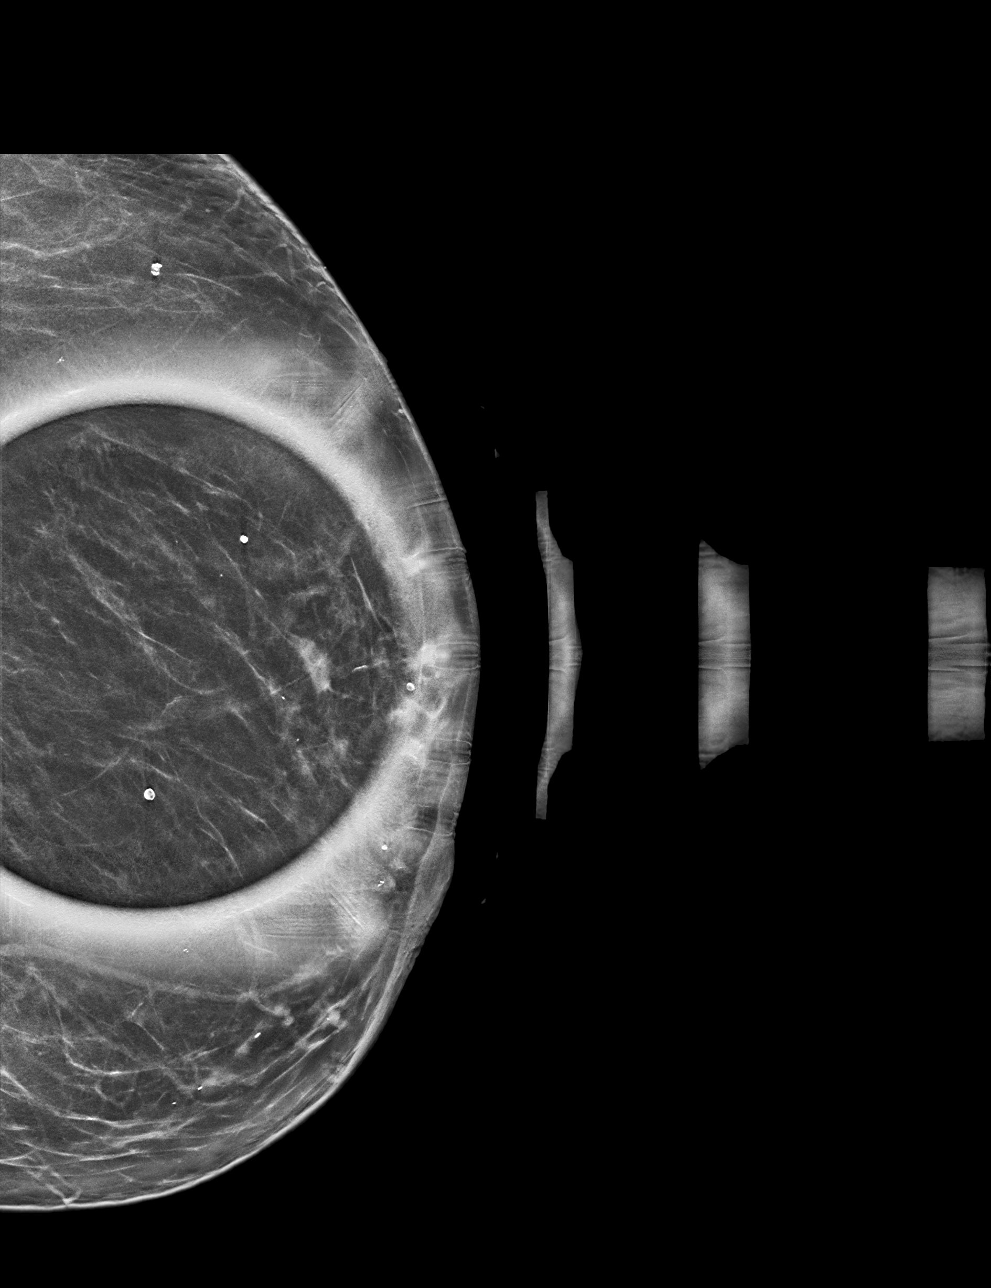

[L ML synth-2D]
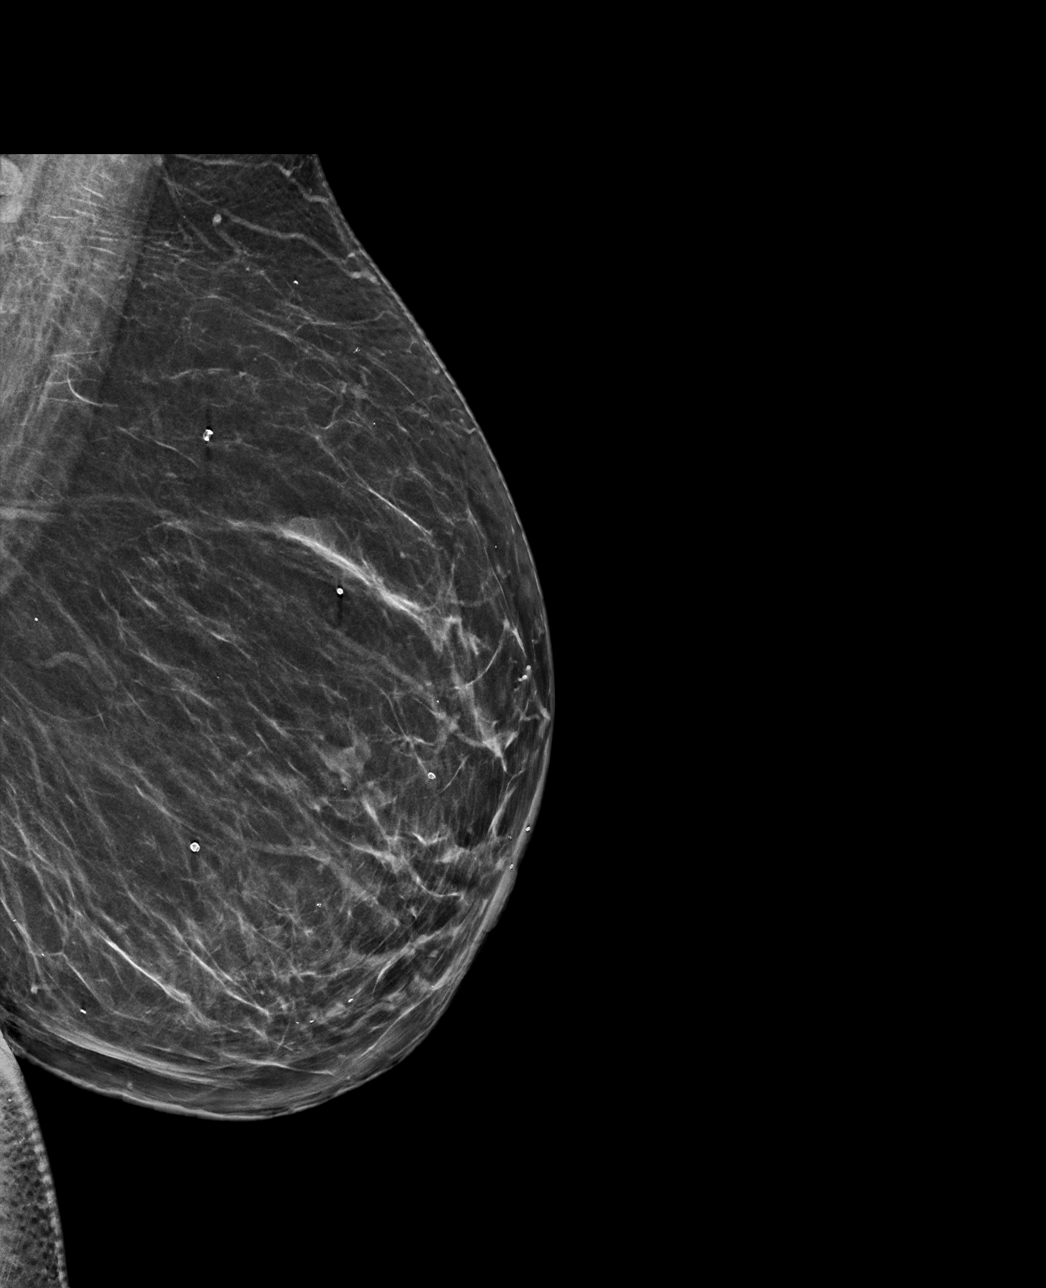

[L ML tomo · tomo slice 35/68.0]
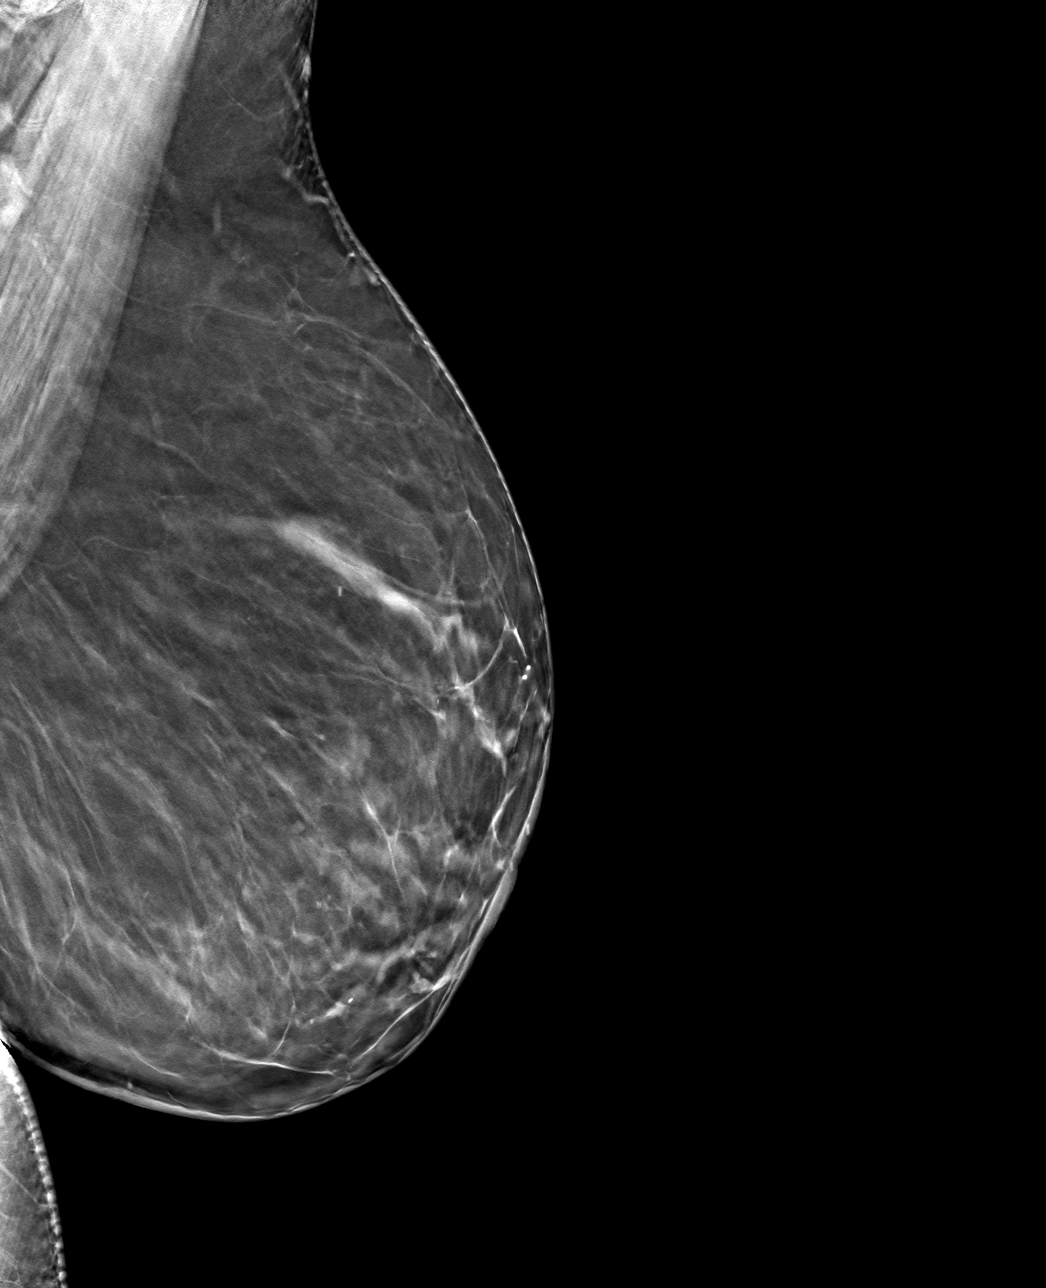

[L MLO tomo · tomo slice 30/59.0]
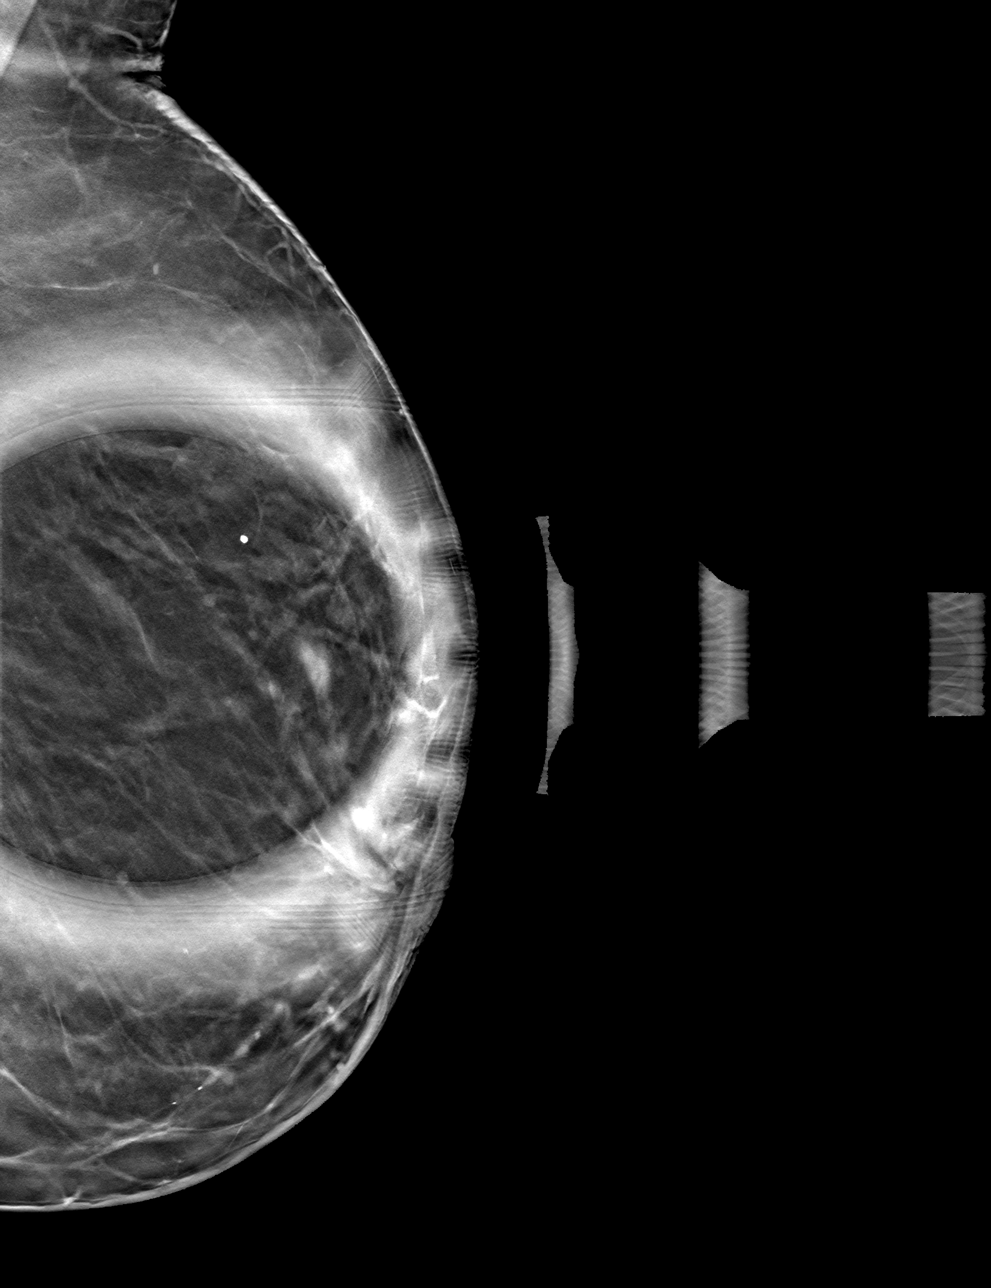

[4 of 12 positions shown; findings below may reference images not displayed]

ACR Breast Density Category b: There are scattered areas of
fibroglandular density.
FINDINGS: The possible mass in the left breast, noted on 1 of the 2 left
breast screening MLO views, disperses on spot compression imaging,
but is noted on the mL images in the lateral left breast. It
measures approximately 9 mm in size, as mostly concave partly
ill-defined margins may reflect an island of normal fibroglandular
tissue. There is an area of subtle, stable architectural distortion
in the left breast deep to the remote excision scar.

On physical exam, there is a faint 2-3 cm scar in the upper left
breast.

Targeted ultrasound is performed, showing mildly dilated ducts in
the left breast at 3 o'clock, 4 cm from the nipple, most prominent
measuring 1.4 x 0.3 x 0.5 cm. There are no solid masses or
suspicious lesions. The dilated duct account for the mammographic
finding.
IMPRESSION: 1. No evidence of breast malignancy.
2. Benign, mildly dilated ducts in the lateral left breast.
3. Subtle benign left breast scarring from a remote excisional
biopsy.

RECOMMENDATION:
1.  Screening mammogram in one year.(Code:[id number]-[id number])

I have discussed the findings and recommendations with the patient.
If applicable, a reminder letter will be sent to the patient
regarding the next appointment.

BI-RADS CATEGORY  2: Benign.

## 2024-02-26 ENCOUNTER — Encounter

## 2024-03-04 ENCOUNTER — Ambulatory Visit
Admission: RE | Admit: 2024-03-04 | Discharge: 2024-03-04 | Disposition: A | Discharge status: Home / Self Care | Source: Ambulatory Visit | Attending: Family Medicine | Admitting: Family Medicine

## 2024-03-04 DIAGNOSIS — R928 Other abnormal and inconclusive findings on diagnostic imaging of breast: Secondary | ICD-10-CM

## 2024-03-04 DIAGNOSIS — R921 Mammographic calcification found on diagnostic imaging of breast: Secondary | ICD-10-CM | POA: Diagnosis not present

## 2024-04-21 DIAGNOSIS — I10 Essential (primary) hypertension: Secondary | ICD-10-CM | POA: Diagnosis not present

## 2024-04-21 DIAGNOSIS — E1169 Type 2 diabetes mellitus with other specified complication: Secondary | ICD-10-CM | POA: Diagnosis not present

## 2024-04-21 DIAGNOSIS — E7800 Pure hypercholesterolemia, unspecified: Secondary | ICD-10-CM | POA: Diagnosis not present

## 2024-04-21 DIAGNOSIS — Z1211 Encounter for screening for malignant neoplasm of colon: Secondary | ICD-10-CM | POA: Diagnosis not present

## 2024-04-21 DIAGNOSIS — Z0000 Encounter for general adult medical examination without abnormal findings: Secondary | ICD-10-CM | POA: Diagnosis not present

## 2024-04-21 DIAGNOSIS — M8580 Other specified disorders of bone density and structure, unspecified site: Secondary | ICD-10-CM | POA: Diagnosis not present

## 2024-04-21 DIAGNOSIS — M25561 Pain in right knee: Secondary | ICD-10-CM | POA: Diagnosis not present

## 2024-05-15 DIAGNOSIS — M25561 Pain in right knee: Secondary | ICD-10-CM | POA: Diagnosis not present

## 2024-05-26 DIAGNOSIS — M6281 Muscle weakness (generalized): Secondary | ICD-10-CM | POA: Diagnosis not present

## 2024-05-26 DIAGNOSIS — M25561 Pain in right knee: Secondary | ICD-10-CM | POA: Diagnosis not present

## 2024-05-26 DIAGNOSIS — M25661 Stiffness of right knee, not elsewhere classified: Secondary | ICD-10-CM | POA: Diagnosis not present

## 2024-05-29 DIAGNOSIS — M25661 Stiffness of right knee, not elsewhere classified: Secondary | ICD-10-CM | POA: Diagnosis not present

## 2024-05-29 DIAGNOSIS — M25561 Pain in right knee: Secondary | ICD-10-CM | POA: Diagnosis not present

## 2024-05-29 DIAGNOSIS — M6281 Muscle weakness (generalized): Secondary | ICD-10-CM | POA: Diagnosis not present

## 2024-06-02 DIAGNOSIS — M25561 Pain in right knee: Secondary | ICD-10-CM | POA: Diagnosis not present

## 2024-06-02 DIAGNOSIS — M25661 Stiffness of right knee, not elsewhere classified: Secondary | ICD-10-CM | POA: Diagnosis not present

## 2024-06-02 DIAGNOSIS — M6281 Muscle weakness (generalized): Secondary | ICD-10-CM | POA: Diagnosis not present

## 2024-06-05 DIAGNOSIS — M6281 Muscle weakness (generalized): Secondary | ICD-10-CM | POA: Diagnosis not present

## 2024-06-05 DIAGNOSIS — M25561 Pain in right knee: Secondary | ICD-10-CM | POA: Diagnosis not present

## 2024-06-05 DIAGNOSIS — M25661 Stiffness of right knee, not elsewhere classified: Secondary | ICD-10-CM | POA: Diagnosis not present

## 2024-06-11 DIAGNOSIS — M25661 Stiffness of right knee, not elsewhere classified: Secondary | ICD-10-CM | POA: Diagnosis not present

## 2024-06-11 DIAGNOSIS — M6281 Muscle weakness (generalized): Secondary | ICD-10-CM | POA: Diagnosis not present

## 2024-06-11 DIAGNOSIS — M25561 Pain in right knee: Secondary | ICD-10-CM | POA: Diagnosis not present

## 2024-06-13 DIAGNOSIS — M25561 Pain in right knee: Secondary | ICD-10-CM | POA: Diagnosis not present

## 2024-06-13 DIAGNOSIS — M25661 Stiffness of right knee, not elsewhere classified: Secondary | ICD-10-CM | POA: Diagnosis not present

## 2024-06-13 DIAGNOSIS — M6281 Muscle weakness (generalized): Secondary | ICD-10-CM | POA: Diagnosis not present

## 2024-06-16 DIAGNOSIS — M6281 Muscle weakness (generalized): Secondary | ICD-10-CM | POA: Diagnosis not present

## 2024-06-16 DIAGNOSIS — M25661 Stiffness of right knee, not elsewhere classified: Secondary | ICD-10-CM | POA: Diagnosis not present

## 2024-06-16 DIAGNOSIS — M25561 Pain in right knee: Secondary | ICD-10-CM | POA: Diagnosis not present

## 2024-06-18 DIAGNOSIS — M25661 Stiffness of right knee, not elsewhere classified: Secondary | ICD-10-CM | POA: Diagnosis not present

## 2024-06-18 DIAGNOSIS — M25561 Pain in right knee: Secondary | ICD-10-CM | POA: Diagnosis not present

## 2024-06-18 DIAGNOSIS — M6281 Muscle weakness (generalized): Secondary | ICD-10-CM | POA: Diagnosis not present

## 2024-06-23 DIAGNOSIS — E119 Type 2 diabetes mellitus without complications: Secondary | ICD-10-CM | POA: Diagnosis not present

## 2024-06-23 DIAGNOSIS — H04123 Dry eye syndrome of bilateral lacrimal glands: Secondary | ICD-10-CM | POA: Diagnosis not present

## 2024-06-23 DIAGNOSIS — H25813 Combined forms of age-related cataract, bilateral: Secondary | ICD-10-CM | POA: Diagnosis not present

## 2024-06-23 DIAGNOSIS — H0102A Squamous blepharitis right eye, upper and lower eyelids: Secondary | ICD-10-CM | POA: Diagnosis not present

## 2024-06-23 DIAGNOSIS — H0102B Squamous blepharitis left eye, upper and lower eyelids: Secondary | ICD-10-CM | POA: Diagnosis not present

## 2024-06-26 DIAGNOSIS — M25561 Pain in right knee: Secondary | ICD-10-CM | POA: Diagnosis not present

## 2024-07-09 DIAGNOSIS — Z800 Family history of malignant neoplasm of digestive organs: Secondary | ICD-10-CM | POA: Diagnosis not present

## 2024-07-09 DIAGNOSIS — Z1211 Encounter for screening for malignant neoplasm of colon: Secondary | ICD-10-CM | POA: Diagnosis not present

## 2024-07-09 DIAGNOSIS — K5730 Diverticulosis of large intestine without perforation or abscess without bleeding: Secondary | ICD-10-CM | POA: Diagnosis not present

## 2024-10-16 ENCOUNTER — Other Ambulatory Visit (HOSPITAL_BASED_OUTPATIENT_CLINIC_OR_DEPARTMENT_OTHER): Payer: Self-pay | Admitting: Family Medicine

## 2024-10-16 ENCOUNTER — Other Ambulatory Visit: Payer: Self-pay | Admitting: Family Medicine

## 2024-10-16 DIAGNOSIS — R109 Unspecified abdominal pain: Secondary | ICD-10-CM

## 2024-10-16 DIAGNOSIS — M8580 Other specified disorders of bone density and structure, unspecified site: Secondary | ICD-10-CM

## 2024-10-29 ENCOUNTER — Inpatient Hospital Stay: Admission: RE | Admit: 2024-10-29 | Discharge: 2024-10-29 | Attending: Family Medicine | Admitting: Family Medicine

## 2024-10-29 DIAGNOSIS — R109 Unspecified abdominal pain: Secondary | ICD-10-CM

## 2024-12-11 ENCOUNTER — Other Ambulatory Visit: Payer: Self-pay | Admitting: Family Medicine

## 2024-12-11 DIAGNOSIS — Z1231 Encounter for screening mammogram for malignant neoplasm of breast: Secondary | ICD-10-CM

## 2025-01-08 ENCOUNTER — Ambulatory Visit
